# Patient Record
Sex: Female | Born: 1970 | Race: White | Hispanic: No | Marital: Married | Smoking: Never smoker
Health system: Southern US, Community
[De-identification: ages and names within clinical notes are randomized; demographics above are authoritative.]

## PROBLEM LIST (undated history)

## (undated) DIAGNOSIS — I10 Essential (primary) hypertension: Secondary | ICD-10-CM

## (undated) DIAGNOSIS — K219 Gastro-esophageal reflux disease without esophagitis: Secondary | ICD-10-CM

## (undated) HISTORY — DX: Gastro-esophageal reflux disease without esophagitis: K21.9

## (undated) HISTORY — DX: Essential (primary) hypertension: I10

---

## 2015-07-07 ENCOUNTER — Other Ambulatory Visit: Payer: Self-pay | Admitting: Bariatrics

## 2015-07-14 ENCOUNTER — Other Ambulatory Visit: Payer: Self-pay

## 2015-07-14 ENCOUNTER — Ambulatory Visit: Payer: BLUE CROSS/BLUE SHIELD

## 2016-06-20 DIAGNOSIS — M9903 Segmental and somatic dysfunction of lumbar region: Secondary | ICD-10-CM | POA: Diagnosis not present

## 2016-06-20 DIAGNOSIS — M9901 Segmental and somatic dysfunction of cervical region: Secondary | ICD-10-CM | POA: Diagnosis not present

## 2016-06-20 DIAGNOSIS — M9902 Segmental and somatic dysfunction of thoracic region: Secondary | ICD-10-CM | POA: Diagnosis not present

## 2016-06-20 DIAGNOSIS — M9905 Segmental and somatic dysfunction of pelvic region: Secondary | ICD-10-CM | POA: Diagnosis not present

## 2016-09-08 ENCOUNTER — Other Ambulatory Visit: Payer: Self-pay | Admitting: Family Medicine

## 2016-09-08 DIAGNOSIS — Z1231 Encounter for screening mammogram for malignant neoplasm of breast: Secondary | ICD-10-CM

## 2016-09-21 ENCOUNTER — Ambulatory Visit
Admission: RE | Admit: 2016-09-21 | Discharge: 2016-09-21 | Disposition: A | Discharge status: Home / Self Care | Payer: BLUE CROSS/BLUE SHIELD | Source: Ambulatory Visit | Attending: Family Medicine | Admitting: Family Medicine

## 2016-09-21 DIAGNOSIS — Z1231 Encounter for screening mammogram for malignant neoplasm of breast: Secondary | ICD-10-CM | POA: Diagnosis not present

## 2016-12-08 DIAGNOSIS — J3089 Other allergic rhinitis: Secondary | ICD-10-CM | POA: Diagnosis not present

## 2016-12-08 DIAGNOSIS — R05 Cough: Secondary | ICD-10-CM | POA: Diagnosis not present

## 2016-12-08 DIAGNOSIS — J301 Allergic rhinitis due to pollen: Secondary | ICD-10-CM | POA: Diagnosis not present

## 2016-12-08 DIAGNOSIS — T781XXA Other adverse food reactions, not elsewhere classified, initial encounter: Secondary | ICD-10-CM | POA: Diagnosis not present

## 2016-12-08 DIAGNOSIS — J3081 Allergic rhinitis due to animal (cat) (dog) hair and dander: Secondary | ICD-10-CM | POA: Diagnosis not present

## 2016-12-20 ENCOUNTER — Other Ambulatory Visit: Payer: Self-pay | Admitting: Family Medicine

## 2016-12-20 ENCOUNTER — Ambulatory Visit
Admission: RE | Admit: 2016-12-20 | Discharge: 2016-12-20 | Disposition: A | Discharge status: Home / Self Care | Payer: BLUE CROSS/BLUE SHIELD | Source: Ambulatory Visit | Attending: Family Medicine | Admitting: Family Medicine

## 2016-12-20 DIAGNOSIS — M545 Low back pain: Secondary | ICD-10-CM

## 2016-12-20 DIAGNOSIS — G8929 Other chronic pain: Secondary | ICD-10-CM | POA: Diagnosis not present

## 2016-12-20 DIAGNOSIS — M47816 Spondylosis without myelopathy or radiculopathy, lumbar region: Secondary | ICD-10-CM | POA: Diagnosis not present

## 2016-12-20 DIAGNOSIS — R198 Other specified symptoms and signs involving the digestive system and abdomen: Secondary | ICD-10-CM | POA: Diagnosis not present

## 2016-12-20 DIAGNOSIS — R5382 Chronic fatigue, unspecified: Secondary | ICD-10-CM | POA: Diagnosis not present

## 2017-01-03 DIAGNOSIS — M545 Low back pain: Secondary | ICD-10-CM | POA: Diagnosis not present

## 2017-01-03 DIAGNOSIS — M25511 Pain in right shoulder: Secondary | ICD-10-CM | POA: Diagnosis not present

## 2017-01-03 DIAGNOSIS — M25512 Pain in left shoulder: Secondary | ICD-10-CM | POA: Diagnosis not present

## 2017-01-04 DIAGNOSIS — M545 Low back pain: Secondary | ICD-10-CM | POA: Diagnosis not present

## 2017-01-04 DIAGNOSIS — M25511 Pain in right shoulder: Secondary | ICD-10-CM | POA: Diagnosis not present

## 2017-01-04 DIAGNOSIS — M25512 Pain in left shoulder: Secondary | ICD-10-CM | POA: Diagnosis not present

## 2017-01-08 DIAGNOSIS — M25511 Pain in right shoulder: Secondary | ICD-10-CM | POA: Diagnosis not present

## 2017-01-08 DIAGNOSIS — M25512 Pain in left shoulder: Secondary | ICD-10-CM | POA: Diagnosis not present

## 2017-01-08 DIAGNOSIS — M545 Low back pain: Secondary | ICD-10-CM | POA: Diagnosis not present

## 2017-01-23 DIAGNOSIS — M25511 Pain in right shoulder: Secondary | ICD-10-CM | POA: Diagnosis not present

## 2017-01-23 DIAGNOSIS — M545 Low back pain: Secondary | ICD-10-CM | POA: Diagnosis not present

## 2017-01-23 DIAGNOSIS — M25512 Pain in left shoulder: Secondary | ICD-10-CM | POA: Diagnosis not present

## 2017-01-31 DIAGNOSIS — M545 Low back pain: Secondary | ICD-10-CM | POA: Diagnosis not present

## 2017-01-31 DIAGNOSIS — M25511 Pain in right shoulder: Secondary | ICD-10-CM | POA: Diagnosis not present

## 2017-01-31 DIAGNOSIS — M25512 Pain in left shoulder: Secondary | ICD-10-CM | POA: Diagnosis not present

## 2017-02-08 DIAGNOSIS — M25511 Pain in right shoulder: Secondary | ICD-10-CM | POA: Diagnosis not present

## 2017-02-08 DIAGNOSIS — M25512 Pain in left shoulder: Secondary | ICD-10-CM | POA: Diagnosis not present

## 2017-02-08 DIAGNOSIS — M545 Low back pain: Secondary | ICD-10-CM | POA: Diagnosis not present

## 2017-02-23 DIAGNOSIS — M25511 Pain in right shoulder: Secondary | ICD-10-CM | POA: Diagnosis not present

## 2017-02-23 DIAGNOSIS — M545 Low back pain: Secondary | ICD-10-CM | POA: Diagnosis not present

## 2017-02-23 DIAGNOSIS — M25512 Pain in left shoulder: Secondary | ICD-10-CM | POA: Diagnosis not present

## 2017-03-01 DIAGNOSIS — M25511 Pain in right shoulder: Secondary | ICD-10-CM | POA: Diagnosis not present

## 2017-03-01 DIAGNOSIS — M25512 Pain in left shoulder: Secondary | ICD-10-CM | POA: Diagnosis not present

## 2017-03-01 DIAGNOSIS — M545 Low back pain: Secondary | ICD-10-CM | POA: Diagnosis not present

## 2017-03-06 DIAGNOSIS — I10 Essential (primary) hypertension: Secondary | ICD-10-CM | POA: Diagnosis not present

## 2017-03-06 DIAGNOSIS — Z86711 Personal history of pulmonary embolism: Secondary | ICD-10-CM | POA: Diagnosis not present

## 2017-03-06 DIAGNOSIS — J309 Allergic rhinitis, unspecified: Secondary | ICD-10-CM | POA: Diagnosis not present

## 2017-03-06 DIAGNOSIS — K219 Gastro-esophageal reflux disease without esophagitis: Secondary | ICD-10-CM | POA: Diagnosis not present

## 2017-03-08 DIAGNOSIS — M25511 Pain in right shoulder: Secondary | ICD-10-CM | POA: Diagnosis not present

## 2017-03-08 DIAGNOSIS — M25512 Pain in left shoulder: Secondary | ICD-10-CM | POA: Diagnosis not present

## 2017-03-08 DIAGNOSIS — M545 Low back pain: Secondary | ICD-10-CM | POA: Diagnosis not present

## 2017-03-12 DIAGNOSIS — M25511 Pain in right shoulder: Secondary | ICD-10-CM | POA: Diagnosis not present

## 2017-03-12 DIAGNOSIS — M545 Low back pain: Secondary | ICD-10-CM | POA: Diagnosis not present

## 2017-03-12 DIAGNOSIS — M25512 Pain in left shoulder: Secondary | ICD-10-CM | POA: Diagnosis not present

## 2017-03-23 DIAGNOSIS — M25512 Pain in left shoulder: Secondary | ICD-10-CM | POA: Diagnosis not present

## 2017-03-23 DIAGNOSIS — M545 Low back pain: Secondary | ICD-10-CM | POA: Diagnosis not present

## 2017-03-23 DIAGNOSIS — M25511 Pain in right shoulder: Secondary | ICD-10-CM | POA: Diagnosis not present

## 2017-03-30 DIAGNOSIS — M25512 Pain in left shoulder: Secondary | ICD-10-CM | POA: Diagnosis not present

## 2017-03-30 DIAGNOSIS — M545 Low back pain: Secondary | ICD-10-CM | POA: Diagnosis not present

## 2017-03-30 DIAGNOSIS — M25511 Pain in right shoulder: Secondary | ICD-10-CM | POA: Diagnosis not present

## 2017-04-06 DIAGNOSIS — K219 Gastro-esophageal reflux disease without esophagitis: Secondary | ICD-10-CM | POA: Diagnosis not present

## 2017-04-06 DIAGNOSIS — M25512 Pain in left shoulder: Secondary | ICD-10-CM | POA: Diagnosis not present

## 2017-04-06 DIAGNOSIS — M545 Low back pain: Secondary | ICD-10-CM | POA: Diagnosis not present

## 2017-04-06 DIAGNOSIS — Z6841 Body Mass Index (BMI) 40.0 and over, adult: Secondary | ICD-10-CM | POA: Diagnosis not present

## 2017-04-06 DIAGNOSIS — M25511 Pain in right shoulder: Secondary | ICD-10-CM | POA: Diagnosis not present

## 2017-04-11 ENCOUNTER — Other Ambulatory Visit (HOSPITAL_COMMUNITY): Payer: Self-pay | Admitting: Surgery

## 2017-04-13 DIAGNOSIS — M25512 Pain in left shoulder: Secondary | ICD-10-CM | POA: Diagnosis not present

## 2017-04-13 DIAGNOSIS — M545 Low back pain: Secondary | ICD-10-CM | POA: Diagnosis not present

## 2017-04-13 DIAGNOSIS — M25511 Pain in right shoulder: Secondary | ICD-10-CM | POA: Diagnosis not present

## 2017-04-24 DIAGNOSIS — M25511 Pain in right shoulder: Secondary | ICD-10-CM | POA: Diagnosis not present

## 2017-04-24 DIAGNOSIS — M545 Low back pain: Secondary | ICD-10-CM | POA: Diagnosis not present

## 2017-04-24 DIAGNOSIS — M25512 Pain in left shoulder: Secondary | ICD-10-CM | POA: Diagnosis not present

## 2017-04-26 ENCOUNTER — Encounter: Payer: Self-pay | Admitting: Registered"

## 2017-04-26 ENCOUNTER — Encounter: Payer: BLUE CROSS/BLUE SHIELD | Attending: General Surgery | Admitting: Registered"

## 2017-04-26 ENCOUNTER — Ambulatory Visit (HOSPITAL_COMMUNITY): Payer: BLUE CROSS/BLUE SHIELD

## 2017-04-26 ENCOUNTER — Other Ambulatory Visit (HOSPITAL_COMMUNITY): Payer: BLUE CROSS/BLUE SHIELD

## 2017-04-26 DIAGNOSIS — Z6841 Body Mass Index (BMI) 40.0 and over, adult: Secondary | ICD-10-CM | POA: Insufficient documentation

## 2017-04-26 DIAGNOSIS — Z713 Dietary counseling and surveillance: Secondary | ICD-10-CM | POA: Insufficient documentation

## 2017-04-26 DIAGNOSIS — E669 Obesity, unspecified: Secondary | ICD-10-CM

## 2017-04-26 NOTE — Progress Notes (Signed)
Pre-Op Assessment Visit:  Pre-Operative LAGB Surgery  Medical Nutrition Therapy:  Appt start time: 3:15  End time:  4:15  Patient was seen on 04/26/2017 for Pre-Operative Nutrition Assessment. Assessment and [facility] of approval [facility] coordinator on 04/26/2017.   Pt expectation of surgery: remove health risk factors, live healthier longer, be able to move more, not be able to use seat belt extender when traveling, be able to choose stores to shop in and not be restricted to plus size stores  Pt expectation of Dietitian: continue dialogue on how to stay motivated and   Start weight at [facility]: 313.8 BMI: 42.86  Pt reports does not eat red meat. Pt is motivated and eager. Pt reports she travels a lot and eats out 3 meals a day, 5x/ week during that time away from home. Pt reports she craves proteins at times. Pt reports she may try and lose weight on her own without needing surgery. Pt states portion control is worse than her food choices. Pt states she has an all or nothing mindset and struggles at times with being really successful, accomplished but unable to master weight.  Pt will find out specifics with insurance. Pt states she may need an assessment + 3 SWL visits   24 hr Dietary Recall: First Meal: banana, breakfast sandwich Snack: none Second Meal: salads (in office), wrap, bag of chips/pretzels, apple, cookie Snack: none Third Meal: 3 chicken wings, falafel, 2 pcs of fried onion, corn Snack: popcorn, ice cream Beverages: Starbucks coffee (once a day), margarita, diet soda (once a day), water  Encouraged to engage in 150 minutes of moderate physical activity including cardiovascular and weight baring weekly  Handouts given during visit include:  . Pre-Op Goals . Bariatric Surgery Protein Shakes . Vitamin and Mineral Supplements  During the appointment today the following Pre-Op Goals were reviewed with the patient: . Maintain  or lose weight as instructed by your surgeon . Make healthy food choices . Begin to limit portion sizes . Limited concentrated sugars and fried foods . Keep fat/sugar in the single digits per serving on          food labels . Practice CHEWING your food  (aim for 30 chews per bite or until applesauce consistency) . Practice not drinking 15 minutes before, during, and 30 minutes after each meal/snack . Avoid all carbonated beverages  . Avoid/limit caffeinated beverages  . Avoid all sugar-sweetened beverages . Consume 3 meals per day; eat every 3-5 hours . Make a list of non-food related activities . Aim for 64-100 ounces of FLUID daily  . Aim for at least 60-80 grams of PROTEIN daily . Look for a liquid protein source that contain ?15 g protein and ?5 g carbohydrate  (ex: shakes, drinks, shots)  Follow diet recommendations listed below  Energy and Macronutrient Recomendations: Calories: 1800 Carbohydrate: 200 Protein: 135 Fat: 50  Demonstrated degree of understanding via:  Teach Back   Teaching Method Utilized:  Visual Auditory Hands on  Barriers to learning/adherence to lifestyle change: none  Patient to call the [facility] to enroll in Pre-Op and Post-Op Nutrition Education when surgery date is scheduled.

## 2017-05-01 DIAGNOSIS — M25511 Pain in right shoulder: Secondary | ICD-10-CM | POA: Diagnosis not present

## 2017-05-01 DIAGNOSIS — M25512 Pain in left shoulder: Secondary | ICD-10-CM | POA: Diagnosis not present

## 2017-05-01 DIAGNOSIS — M545 Low back pain: Secondary | ICD-10-CM | POA: Diagnosis not present

## 2017-05-17 ENCOUNTER — Ambulatory Visit (HOSPITAL_COMMUNITY)
Admission: RE | Admit: 2017-05-17 | Discharge: 2017-05-17 | Disposition: A | Discharge status: Home / Self Care | Payer: BLUE CROSS/BLUE SHIELD | Source: Ambulatory Visit | Attending: Surgery | Admitting: Surgery

## 2017-05-17 ENCOUNTER — Encounter (HOSPITAL_COMMUNITY): Payer: Self-pay | Admitting: Radiology

## 2017-05-17 ENCOUNTER — Other Ambulatory Visit: Payer: Self-pay

## 2017-05-17 DIAGNOSIS — M25512 Pain in left shoulder: Secondary | ICD-10-CM | POA: Diagnosis not present

## 2017-05-17 DIAGNOSIS — M545 Low back pain: Secondary | ICD-10-CM | POA: Diagnosis not present

## 2017-05-17 DIAGNOSIS — R9431 Abnormal electrocardiogram [ECG] [EKG]: Secondary | ICD-10-CM | POA: Diagnosis not present

## 2017-05-17 DIAGNOSIS — K219 Gastro-esophageal reflux disease without esophagitis: Secondary | ICD-10-CM | POA: Diagnosis not present

## 2017-05-17 DIAGNOSIS — R918 Other nonspecific abnormal finding of lung field: Secondary | ICD-10-CM | POA: Insufficient documentation

## 2017-05-17 DIAGNOSIS — K449 Diaphragmatic hernia without obstruction or gangrene: Secondary | ICD-10-CM | POA: Insufficient documentation

## 2017-05-17 DIAGNOSIS — Z01818 Encounter for other preprocedural examination: Secondary | ICD-10-CM | POA: Diagnosis not present

## 2017-05-17 DIAGNOSIS — M25511 Pain in right shoulder: Secondary | ICD-10-CM | POA: Diagnosis not present

## 2017-05-25 ENCOUNTER — Ambulatory Visit: Payer: BLUE CROSS/BLUE SHIELD | Admitting: Registered"

## 2017-05-29 DIAGNOSIS — M545 Low back pain: Secondary | ICD-10-CM | POA: Diagnosis not present

## 2017-05-29 DIAGNOSIS — M25511 Pain in right shoulder: Secondary | ICD-10-CM | POA: Diagnosis not present

## 2017-05-29 DIAGNOSIS — M25512 Pain in left shoulder: Secondary | ICD-10-CM | POA: Diagnosis not present

## 2017-06-26 DIAGNOSIS — M25511 Pain in right shoulder: Secondary | ICD-10-CM | POA: Diagnosis not present

## 2017-06-26 DIAGNOSIS — M25512 Pain in left shoulder: Secondary | ICD-10-CM | POA: Diagnosis not present

## 2017-06-26 DIAGNOSIS — M545 Low back pain: Secondary | ICD-10-CM | POA: Diagnosis not present

## 2017-06-29 DIAGNOSIS — T781XXD Other adverse food reactions, not elsewhere classified, subsequent encounter: Secondary | ICD-10-CM | POA: Diagnosis not present

## 2017-06-29 DIAGNOSIS — J301 Allergic rhinitis due to pollen: Secondary | ICD-10-CM | POA: Diagnosis not present

## 2017-06-29 DIAGNOSIS — J3081 Allergic rhinitis due to animal (cat) (dog) hair and dander: Secondary | ICD-10-CM | POA: Diagnosis not present

## 2017-06-29 DIAGNOSIS — J3089 Other allergic rhinitis: Secondary | ICD-10-CM | POA: Diagnosis not present

## 2017-07-20 DIAGNOSIS — M25512 Pain in left shoulder: Secondary | ICD-10-CM | POA: Diagnosis not present

## 2017-07-20 DIAGNOSIS — M545 Low back pain: Secondary | ICD-10-CM | POA: Diagnosis not present

## 2017-07-20 DIAGNOSIS — M25511 Pain in right shoulder: Secondary | ICD-10-CM | POA: Diagnosis not present

## 2017-10-29 DIAGNOSIS — Z6841 Body Mass Index (BMI) 40.0 and over, adult: Secondary | ICD-10-CM | POA: Diagnosis not present

## 2017-10-29 DIAGNOSIS — Z131 Encounter for screening for diabetes mellitus: Secondary | ICD-10-CM | POA: Diagnosis not present

## 2017-10-29 DIAGNOSIS — E782 Mixed hyperlipidemia: Secondary | ICD-10-CM | POA: Diagnosis not present

## 2017-10-29 DIAGNOSIS — Z0000 Encounter for general adult medical examination without abnormal findings: Secondary | ICD-10-CM | POA: Diagnosis not present

## 2017-10-29 DIAGNOSIS — I10 Essential (primary) hypertension: Secondary | ICD-10-CM | POA: Diagnosis not present

## 2018-02-20 ENCOUNTER — Other Ambulatory Visit: Payer: Self-pay | Admitting: Family Medicine

## 2018-02-20 DIAGNOSIS — Z1231 Encounter for screening mammogram for malignant neoplasm of breast: Secondary | ICD-10-CM

## 2018-02-28 DIAGNOSIS — R635 Abnormal weight gain: Secondary | ICD-10-CM | POA: Diagnosis not present

## 2018-02-28 DIAGNOSIS — Z6841 Body Mass Index (BMI) 40.0 and over, adult: Secondary | ICD-10-CM | POA: Diagnosis not present

## 2018-03-05 DIAGNOSIS — I10 Essential (primary) hypertension: Secondary | ICD-10-CM | POA: Diagnosis not present

## 2018-03-05 DIAGNOSIS — Z6841 Body Mass Index (BMI) 40.0 and over, adult: Secondary | ICD-10-CM | POA: Diagnosis not present

## 2018-03-05 DIAGNOSIS — R7303 Prediabetes: Secondary | ICD-10-CM | POA: Diagnosis not present

## 2018-03-06 DIAGNOSIS — R9431 Abnormal electrocardiogram [ECG] [EKG]: Secondary | ICD-10-CM | POA: Diagnosis not present

## 2018-03-07 ENCOUNTER — Encounter: Payer: Self-pay | Admitting: Radiology

## 2018-03-07 ENCOUNTER — Ambulatory Visit
Admission: RE | Admit: 2018-03-07 | Discharge: 2018-03-07 | Disposition: A | Discharge status: Home / Self Care | Payer: BLUE CROSS/BLUE SHIELD | Source: Ambulatory Visit | Attending: Family Medicine | Admitting: Family Medicine

## 2018-03-07 DIAGNOSIS — Z1231 Encounter for screening mammogram for malignant neoplasm of breast: Secondary | ICD-10-CM

## 2018-06-07 DIAGNOSIS — T781XXD Other adverse food reactions, not elsewhere classified, subsequent encounter: Secondary | ICD-10-CM | POA: Diagnosis not present

## 2018-06-07 DIAGNOSIS — J3081 Allergic rhinitis due to animal (cat) (dog) hair and dander: Secondary | ICD-10-CM | POA: Diagnosis not present

## 2018-06-07 DIAGNOSIS — J3089 Other allergic rhinitis: Secondary | ICD-10-CM | POA: Diagnosis not present

## 2018-06-07 DIAGNOSIS — J301 Allergic rhinitis due to pollen: Secondary | ICD-10-CM | POA: Diagnosis not present

## 2018-11-15 DIAGNOSIS — Z23 Encounter for immunization: Secondary | ICD-10-CM | POA: Diagnosis not present

## 2018-11-15 DIAGNOSIS — I10 Essential (primary) hypertension: Secondary | ICD-10-CM | POA: Diagnosis not present

## 2018-11-15 DIAGNOSIS — E782 Mixed hyperlipidemia: Secondary | ICD-10-CM | POA: Diagnosis not present

## 2018-11-15 DIAGNOSIS — R7303 Prediabetes: Secondary | ICD-10-CM | POA: Diagnosis not present

## 2019-05-01 DIAGNOSIS — I10 Essential (primary) hypertension: Secondary | ICD-10-CM | POA: Diagnosis not present

## 2019-05-01 DIAGNOSIS — R7303 Prediabetes: Secondary | ICD-10-CM | POA: Diagnosis not present

## 2019-05-01 DIAGNOSIS — Z6841 Body Mass Index (BMI) 40.0 and over, adult: Secondary | ICD-10-CM | POA: Diagnosis not present

## 2019-05-23 DIAGNOSIS — I10 Essential (primary) hypertension: Secondary | ICD-10-CM | POA: Diagnosis not present

## 2019-05-23 DIAGNOSIS — E782 Mixed hyperlipidemia: Secondary | ICD-10-CM | POA: Diagnosis not present

## 2019-05-23 DIAGNOSIS — R7303 Prediabetes: Secondary | ICD-10-CM | POA: Diagnosis not present

## 2019-05-26 DIAGNOSIS — Z0000 Encounter for general adult medical examination without abnormal findings: Secondary | ICD-10-CM | POA: Diagnosis not present

## 2019-05-29 DIAGNOSIS — H903 Sensorineural hearing loss, bilateral: Secondary | ICD-10-CM | POA: Diagnosis not present

## 2019-05-29 DIAGNOSIS — H9313 Tinnitus, bilateral: Secondary | ICD-10-CM | POA: Diagnosis not present

## 2019-06-06 DIAGNOSIS — J3089 Other allergic rhinitis: Secondary | ICD-10-CM | POA: Diagnosis not present

## 2019-06-06 DIAGNOSIS — T781XXD Other adverse food reactions, not elsewhere classified, subsequent encounter: Secondary | ICD-10-CM | POA: Diagnosis not present

## 2019-06-06 DIAGNOSIS — J3081 Allergic rhinitis due to animal (cat) (dog) hair and dander: Secondary | ICD-10-CM | POA: Diagnosis not present

## 2019-06-06 DIAGNOSIS — J301 Allergic rhinitis due to pollen: Secondary | ICD-10-CM | POA: Diagnosis not present

## 2019-06-24 ENCOUNTER — Other Ambulatory Visit: Payer: Self-pay | Admitting: Family Medicine

## 2019-06-24 DIAGNOSIS — Z1231 Encounter for screening mammogram for malignant neoplasm of breast: Secondary | ICD-10-CM

## 2019-08-01 ENCOUNTER — Ambulatory Visit
Admission: RE | Admit: 2019-08-01 | Discharge: 2019-08-01 | Disposition: A | Discharge status: Home / Self Care | Payer: BLUE CROSS/BLUE SHIELD | Source: Ambulatory Visit | Attending: Family Medicine | Admitting: Family Medicine

## 2019-08-01 ENCOUNTER — Other Ambulatory Visit: Payer: Self-pay

## 2019-08-01 DIAGNOSIS — Z1231 Encounter for screening mammogram for malignant neoplasm of breast: Secondary | ICD-10-CM

## 2019-08-07 ENCOUNTER — Ambulatory Visit: Payer: BLUE CROSS/BLUE SHIELD

## 2019-08-08 DIAGNOSIS — E781 Pure hyperglyceridemia: Secondary | ICD-10-CM | POA: Diagnosis not present

## 2019-08-08 DIAGNOSIS — I10 Essential (primary) hypertension: Secondary | ICD-10-CM | POA: Diagnosis not present

## 2019-08-08 DIAGNOSIS — R7303 Prediabetes: Secondary | ICD-10-CM | POA: Diagnosis not present

## 2019-08-08 DIAGNOSIS — E8881 Metabolic syndrome: Secondary | ICD-10-CM | POA: Diagnosis not present

## 2019-08-27 DIAGNOSIS — I10 Essential (primary) hypertension: Secondary | ICD-10-CM | POA: Diagnosis not present

## 2019-08-27 DIAGNOSIS — R7303 Prediabetes: Secondary | ICD-10-CM | POA: Diagnosis not present

## 2019-08-27 DIAGNOSIS — E782 Mixed hyperlipidemia: Secondary | ICD-10-CM | POA: Diagnosis not present

## 2019-11-19 DIAGNOSIS — E781 Pure hyperglyceridemia: Secondary | ICD-10-CM | POA: Diagnosis not present

## 2019-11-19 DIAGNOSIS — R7303 Prediabetes: Secondary | ICD-10-CM | POA: Diagnosis not present

## 2019-11-19 DIAGNOSIS — I10 Essential (primary) hypertension: Secondary | ICD-10-CM | POA: Diagnosis not present

## 2019-11-19 DIAGNOSIS — E8881 Metabolic syndrome: Secondary | ICD-10-CM | POA: Diagnosis not present

## 2020-02-10 DIAGNOSIS — E669 Obesity, unspecified: Secondary | ICD-10-CM | POA: Diagnosis not present

## 2020-02-10 DIAGNOSIS — I10 Essential (primary) hypertension: Secondary | ICD-10-CM | POA: Diagnosis not present

## 2020-02-10 DIAGNOSIS — E8881 Metabolic syndrome: Secondary | ICD-10-CM | POA: Diagnosis not present

## 2020-02-10 DIAGNOSIS — R7303 Prediabetes: Secondary | ICD-10-CM | POA: Diagnosis not present

## 2020-03-29 DIAGNOSIS — Z713 Dietary counseling and surveillance: Secondary | ICD-10-CM | POA: Diagnosis not present

## 2020-03-29 DIAGNOSIS — Z6841 Body Mass Index (BMI) 40.0 and over, adult: Secondary | ICD-10-CM | POA: Diagnosis not present

## 2020-04-20 DIAGNOSIS — F4322 Adjustment disorder with anxiety: Secondary | ICD-10-CM | POA: Diagnosis not present

## 2020-04-27 DIAGNOSIS — F4322 Adjustment disorder with anxiety: Secondary | ICD-10-CM | POA: Diagnosis not present

## 2020-05-04 DIAGNOSIS — F4322 Adjustment disorder with anxiety: Secondary | ICD-10-CM | POA: Diagnosis not present

## 2020-05-11 DIAGNOSIS — F4322 Adjustment disorder with anxiety: Secondary | ICD-10-CM | POA: Diagnosis not present

## 2020-05-18 DIAGNOSIS — F4322 Adjustment disorder with anxiety: Secondary | ICD-10-CM | POA: Diagnosis not present

## 2020-05-25 DIAGNOSIS — F4322 Adjustment disorder with anxiety: Secondary | ICD-10-CM | POA: Diagnosis not present

## 2020-06-01 DIAGNOSIS — F4322 Adjustment disorder with anxiety: Secondary | ICD-10-CM | POA: Diagnosis not present

## 2020-06-04 DIAGNOSIS — J3089 Other allergic rhinitis: Secondary | ICD-10-CM | POA: Diagnosis not present

## 2020-06-04 DIAGNOSIS — T781XXD Other adverse food reactions, not elsewhere classified, subsequent encounter: Secondary | ICD-10-CM | POA: Diagnosis not present

## 2020-06-04 DIAGNOSIS — J301 Allergic rhinitis due to pollen: Secondary | ICD-10-CM | POA: Diagnosis not present

## 2020-06-04 DIAGNOSIS — J3081 Allergic rhinitis due to animal (cat) (dog) hair and dander: Secondary | ICD-10-CM | POA: Diagnosis not present

## 2020-06-15 DIAGNOSIS — R7303 Prediabetes: Secondary | ICD-10-CM | POA: Diagnosis not present

## 2020-06-15 DIAGNOSIS — I10 Essential (primary) hypertension: Secondary | ICD-10-CM | POA: Diagnosis not present

## 2020-06-15 DIAGNOSIS — E8881 Metabolic syndrome: Secondary | ICD-10-CM | POA: Diagnosis not present

## 2020-06-15 DIAGNOSIS — R635 Abnormal weight gain: Secondary | ICD-10-CM | POA: Diagnosis not present

## 2020-07-20 ENCOUNTER — Other Ambulatory Visit (HOSPITAL_COMMUNITY)
Admission: RE | Admit: 2020-07-20 | Discharge: 2020-07-20 | Disposition: A | Discharge status: Home / Self Care | Payer: BC Managed Care – PPO | Source: Ambulatory Visit | Attending: Family Medicine | Admitting: Family Medicine

## 2020-07-20 ENCOUNTER — Other Ambulatory Visit: Payer: Self-pay | Admitting: Family Medicine

## 2020-07-20 DIAGNOSIS — E782 Mixed hyperlipidemia: Secondary | ICD-10-CM | POA: Diagnosis not present

## 2020-07-20 DIAGNOSIS — E669 Obesity, unspecified: Secondary | ICD-10-CM | POA: Diagnosis not present

## 2020-07-20 DIAGNOSIS — Z124 Encounter for screening for malignant neoplasm of cervix: Secondary | ICD-10-CM | POA: Diagnosis not present

## 2020-07-20 DIAGNOSIS — I10 Essential (primary) hypertension: Secondary | ICD-10-CM | POA: Diagnosis not present

## 2020-07-20 DIAGNOSIS — Z01411 Encounter for gynecological examination (general) (routine) with abnormal findings: Secondary | ICD-10-CM | POA: Insufficient documentation

## 2020-07-20 DIAGNOSIS — E8881 Metabolic syndrome: Secondary | ICD-10-CM | POA: Diagnosis not present

## 2020-07-20 DIAGNOSIS — Z0000 Encounter for general adult medical examination without abnormal findings: Secondary | ICD-10-CM | POA: Diagnosis not present

## 2020-07-20 DIAGNOSIS — R7303 Prediabetes: Secondary | ICD-10-CM | POA: Diagnosis not present

## 2020-07-22 LAB — CYTOLOGY - PAP
Comment: NEGATIVE
Diagnosis: NEGATIVE
High risk HPV: NEGATIVE

## 2020-07-29 ENCOUNTER — Other Ambulatory Visit: Payer: Self-pay | Admitting: Family Medicine

## 2020-07-29 DIAGNOSIS — Z1231 Encounter for screening mammogram for malignant neoplasm of breast: Secondary | ICD-10-CM

## 2020-08-04 ENCOUNTER — Ambulatory Visit
Admission: RE | Admit: 2020-08-04 | Discharge: 2020-08-04 | Disposition: A | Discharge status: Home / Self Care | Payer: BC Managed Care – PPO | Source: Ambulatory Visit

## 2020-08-04 ENCOUNTER — Other Ambulatory Visit: Payer: Self-pay

## 2020-08-04 DIAGNOSIS — Z1231 Encounter for screening mammogram for malignant neoplasm of breast: Secondary | ICD-10-CM | POA: Diagnosis not present

## 2020-11-05 DIAGNOSIS — Z7984 Long term (current) use of oral hypoglycemic drugs: Secondary | ICD-10-CM | POA: Diagnosis not present

## 2020-11-05 DIAGNOSIS — R7303 Prediabetes: Secondary | ICD-10-CM | POA: Diagnosis not present

## 2020-11-05 DIAGNOSIS — I10 Essential (primary) hypertension: Secondary | ICD-10-CM | POA: Diagnosis not present

## 2020-11-05 DIAGNOSIS — E786 Lipoprotein deficiency: Secondary | ICD-10-CM | POA: Diagnosis not present

## 2021-02-08 DIAGNOSIS — R7303 Prediabetes: Secondary | ICD-10-CM | POA: Diagnosis not present

## 2021-02-08 DIAGNOSIS — I10 Essential (primary) hypertension: Secondary | ICD-10-CM | POA: Diagnosis not present

## 2021-02-18 DIAGNOSIS — Z6839 Body mass index (BMI) 39.0-39.9, adult: Secondary | ICD-10-CM | POA: Diagnosis not present

## 2021-02-18 DIAGNOSIS — R7303 Prediabetes: Secondary | ICD-10-CM | POA: Diagnosis not present

## 2021-02-18 DIAGNOSIS — E569 Vitamin deficiency, unspecified: Secondary | ICD-10-CM | POA: Diagnosis not present

## 2021-02-18 DIAGNOSIS — I10 Essential (primary) hypertension: Secondary | ICD-10-CM | POA: Diagnosis not present

## 2021-02-18 DIAGNOSIS — E781 Pure hyperglyceridemia: Secondary | ICD-10-CM | POA: Diagnosis not present

## 2021-02-18 DIAGNOSIS — E8881 Metabolic syndrome: Secondary | ICD-10-CM | POA: Diagnosis not present

## 2021-03-12 IMAGING — MG DIGITAL SCREENING BILAT W/ TOMO W/ CAD
8 series · 8 of 24 positions shown · non-contrast
Comparison: Previous exam(s).

CLINICAL DATA: Screening.

EXAM:
DIGITAL SCREENING BILATERAL MAMMOGRAM WITH TOMO AND CAD

[R MLO synth-2D]
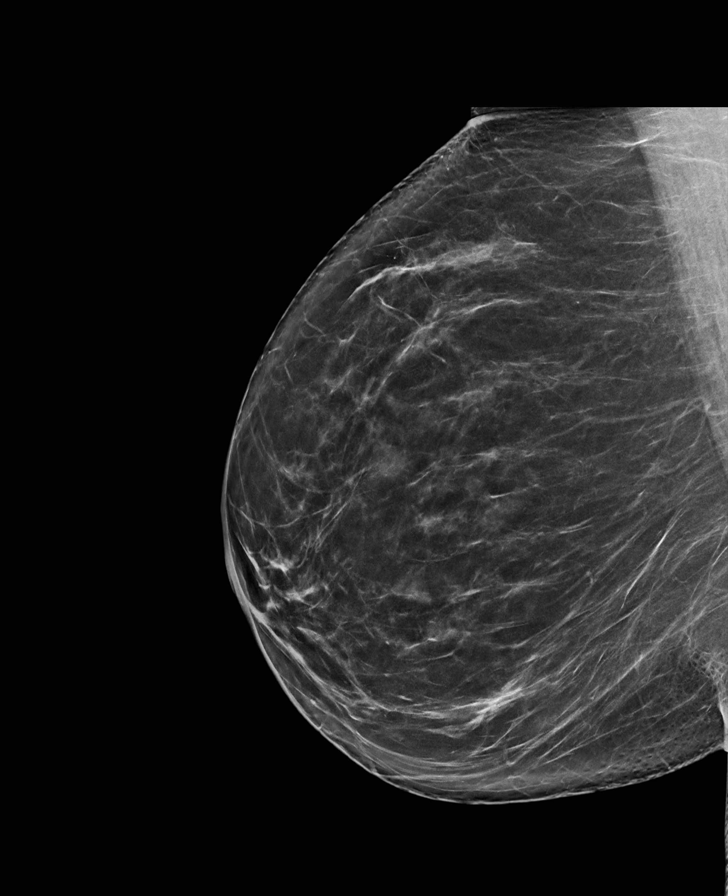

[L CC synth-2D]
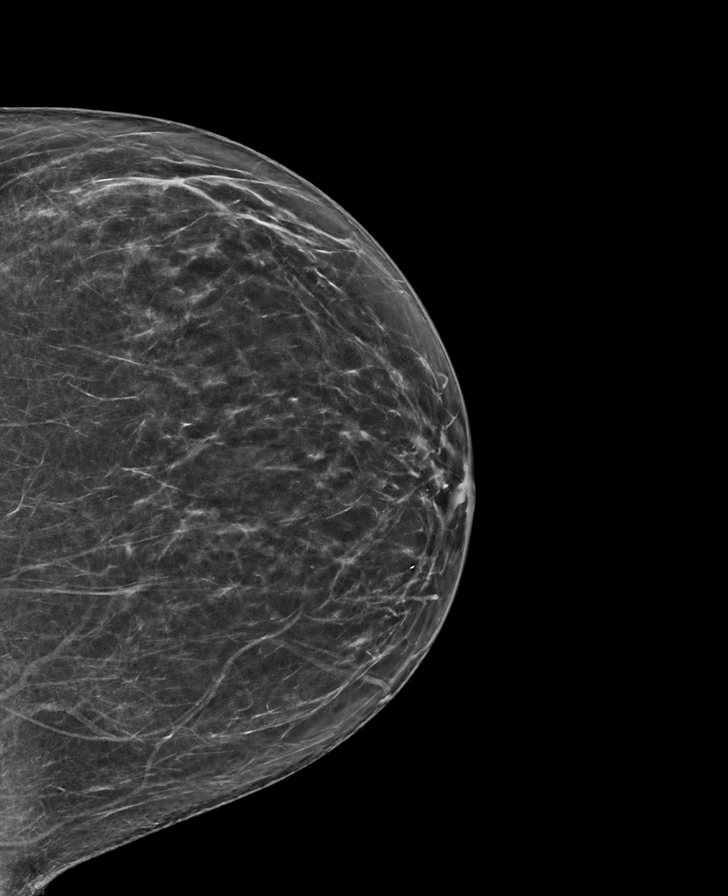

[R CC synth-2D]
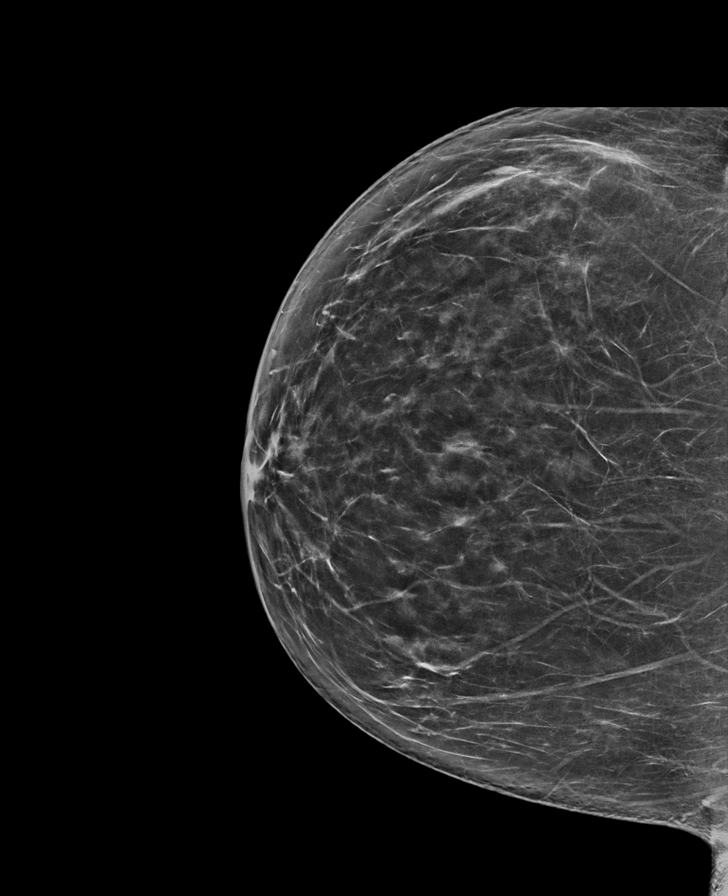

[L MLO synth-2D]
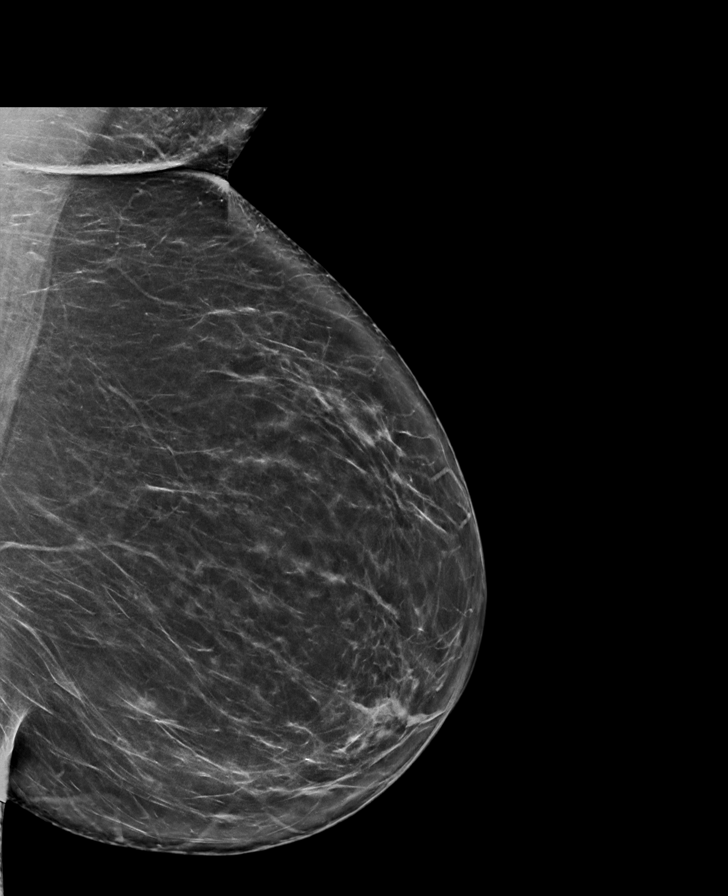

[R CC tomo · tomo slice 38/75.0]
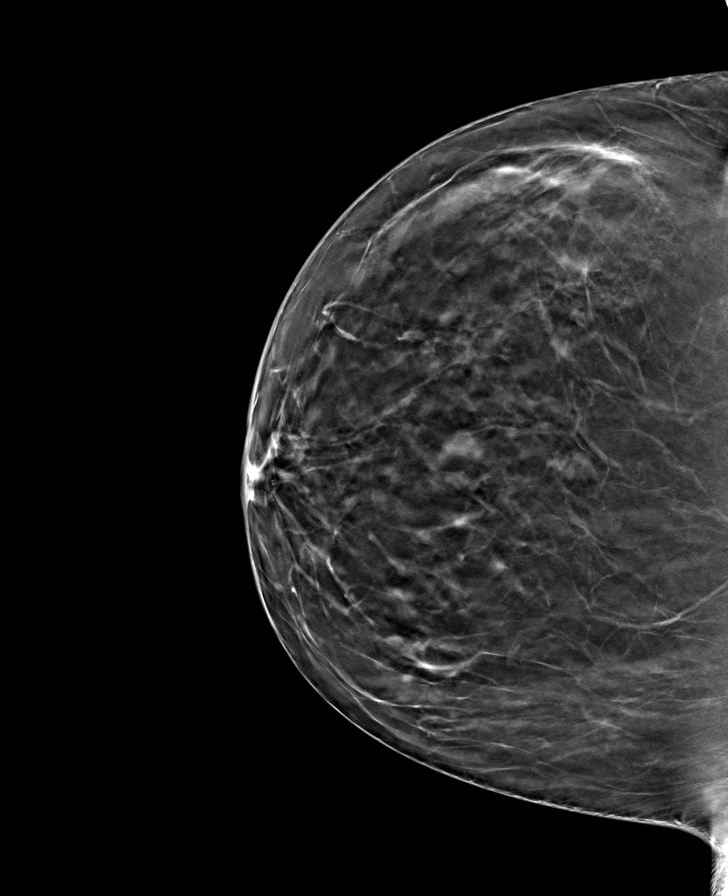

[L CC tomo · tomo slice 35/70.0]
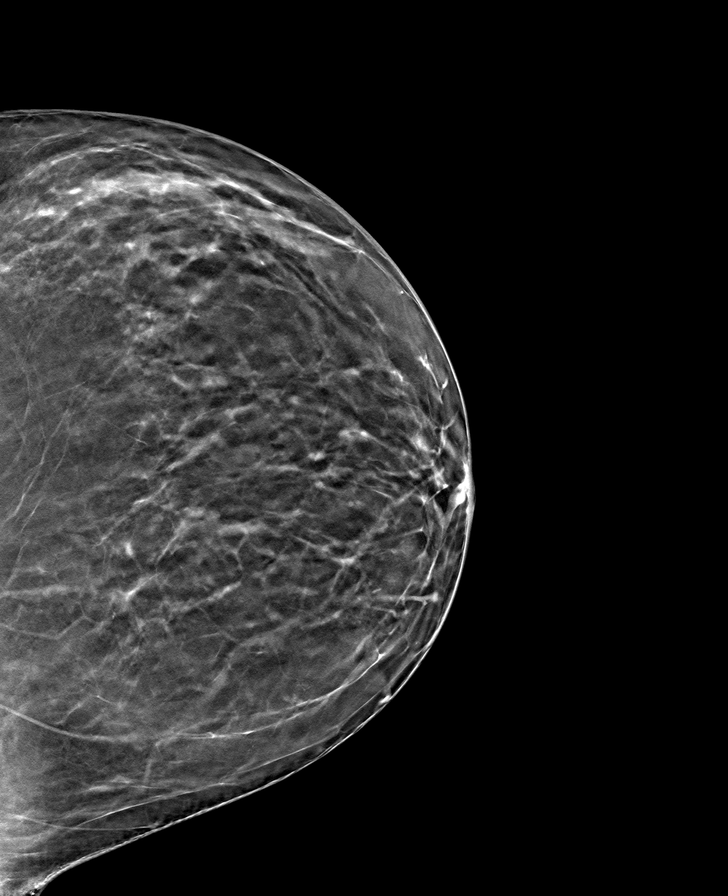

[R MLO tomo · tomo slice 43/84.0]
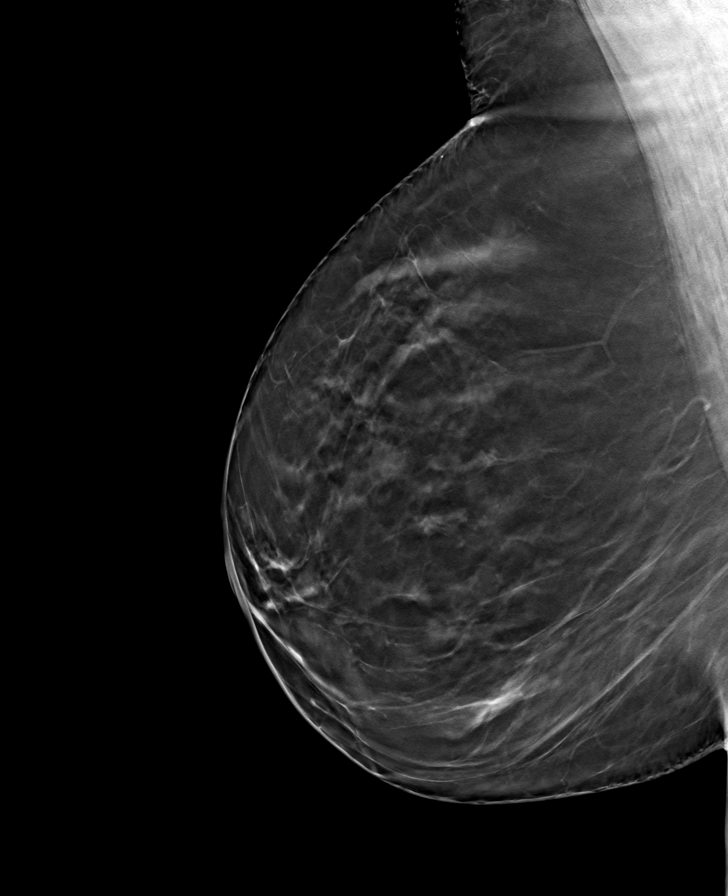

[L MLO tomo · tomo slice 43/85.0]
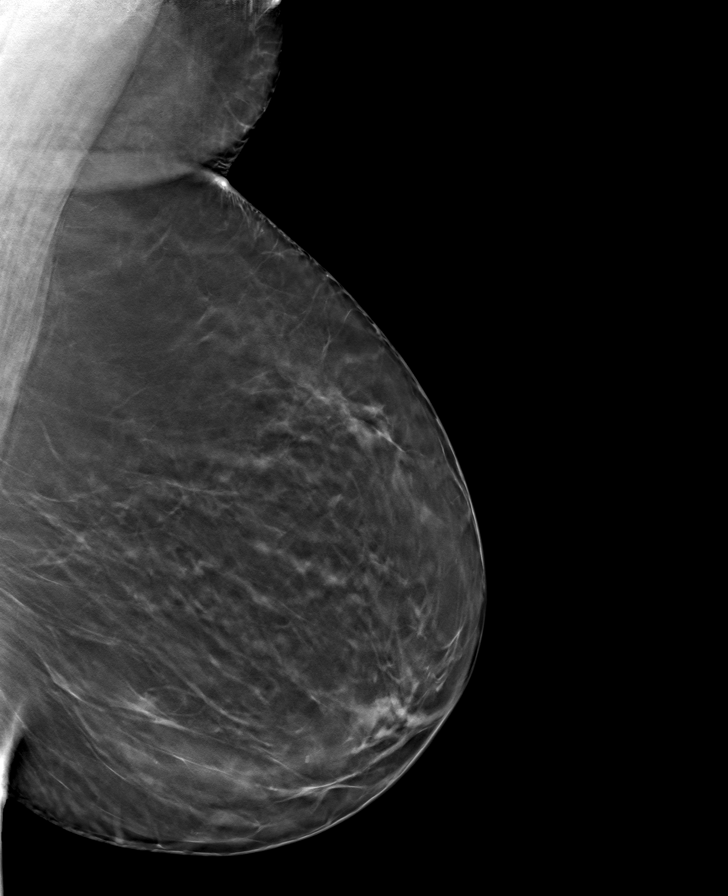

[8 of 24 positions shown; findings below may reference images not displayed]

ACR Breast Density Category b: There are scattered areas of
fibroglandular density.
FINDINGS: There are no findings suspicious for malignancy. Images were
processed with CAD.
IMPRESSION: No mammographic evidence of malignancy. A result letter of this
screening mammogram will be mailed directly to the patient.

RECOMMENDATION:
Screening mammogram in one year. (Code:[id number])

BI-RADS CATEGORY  1: Negative.

## 2021-05-12 DIAGNOSIS — R7303 Prediabetes: Secondary | ICD-10-CM | POA: Diagnosis not present

## 2021-05-12 DIAGNOSIS — E785 Hyperlipidemia, unspecified: Secondary | ICD-10-CM | POA: Diagnosis not present

## 2021-05-12 DIAGNOSIS — E669 Obesity, unspecified: Secondary | ICD-10-CM | POA: Diagnosis not present

## 2021-05-31 DIAGNOSIS — L03313 Cellulitis of chest wall: Secondary | ICD-10-CM | POA: Diagnosis not present

## 2021-06-23 DIAGNOSIS — R7303 Prediabetes: Secondary | ICD-10-CM | POA: Diagnosis not present

## 2021-06-23 DIAGNOSIS — E559 Vitamin D deficiency, unspecified: Secondary | ICD-10-CM | POA: Diagnosis not present

## 2021-06-23 DIAGNOSIS — E785 Hyperlipidemia, unspecified: Secondary | ICD-10-CM | POA: Diagnosis not present

## 2021-07-25 DIAGNOSIS — Z0000 Encounter for general adult medical examination without abnormal findings: Secondary | ICD-10-CM | POA: Diagnosis not present

## 2021-07-25 DIAGNOSIS — I10 Essential (primary) hypertension: Secondary | ICD-10-CM | POA: Diagnosis not present

## 2021-07-25 DIAGNOSIS — E782 Mixed hyperlipidemia: Secondary | ICD-10-CM | POA: Diagnosis not present

## 2021-07-25 DIAGNOSIS — R7303 Prediabetes: Secondary | ICD-10-CM | POA: Diagnosis not present

## 2021-09-09 DIAGNOSIS — E669 Obesity, unspecified: Secondary | ICD-10-CM | POA: Diagnosis not present

## 2021-09-09 DIAGNOSIS — Z6837 Body mass index (BMI) 37.0-37.9, adult: Secondary | ICD-10-CM | POA: Diagnosis not present

## 2021-09-09 DIAGNOSIS — R197 Diarrhea, unspecified: Secondary | ICD-10-CM | POA: Diagnosis not present

## 2021-09-09 DIAGNOSIS — E781 Pure hyperglyceridemia: Secondary | ICD-10-CM | POA: Diagnosis not present

## 2021-10-20 ENCOUNTER — Other Ambulatory Visit: Payer: Self-pay | Admitting: Family Medicine

## 2021-10-20 DIAGNOSIS — Z1231 Encounter for screening mammogram for malignant neoplasm of breast: Secondary | ICD-10-CM

## 2021-10-28 ENCOUNTER — Ambulatory Visit
Admission: RE | Admit: 2021-10-28 | Discharge: 2021-10-28 | Disposition: A | Discharge status: Home / Self Care | Payer: BC Managed Care – PPO | Source: Ambulatory Visit

## 2021-10-28 ENCOUNTER — Other Ambulatory Visit: Payer: Self-pay

## 2021-10-28 DIAGNOSIS — Z1231 Encounter for screening mammogram for malignant neoplasm of breast: Secondary | ICD-10-CM | POA: Diagnosis not present

## 2021-10-28 DIAGNOSIS — F5104 Psychophysiologic insomnia: Secondary | ICD-10-CM | POA: Diagnosis not present

## 2021-10-28 DIAGNOSIS — R7303 Prediabetes: Secondary | ICD-10-CM | POA: Diagnosis not present

## 2021-11-24 DIAGNOSIS — K219 Gastro-esophageal reflux disease without esophagitis: Secondary | ICD-10-CM | POA: Diagnosis not present

## 2021-11-24 DIAGNOSIS — Z1211 Encounter for screening for malignant neoplasm of colon: Secondary | ICD-10-CM | POA: Diagnosis not present

## 2021-11-24 DIAGNOSIS — R194 Change in bowel habit: Secondary | ICD-10-CM | POA: Diagnosis not present

## 2021-12-06 DIAGNOSIS — Z1211 Encounter for screening for malignant neoplasm of colon: Secondary | ICD-10-CM | POA: Diagnosis not present

## 2021-12-13 DIAGNOSIS — I10 Essential (primary) hypertension: Secondary | ICD-10-CM | POA: Diagnosis not present

## 2021-12-13 DIAGNOSIS — E669 Obesity, unspecified: Secondary | ICD-10-CM | POA: Diagnosis not present

## 2021-12-13 DIAGNOSIS — R7303 Prediabetes: Secondary | ICD-10-CM | POA: Diagnosis not present

## 2021-12-13 DIAGNOSIS — E781 Pure hyperglyceridemia: Secondary | ICD-10-CM | POA: Diagnosis not present

## 2022-02-15 DIAGNOSIS — E119 Type 2 diabetes mellitus without complications: Secondary | ICD-10-CM | POA: Diagnosis not present

## 2022-02-20 DIAGNOSIS — Z1211 Encounter for screening for malignant neoplasm of colon: Secondary | ICD-10-CM | POA: Diagnosis not present

## 2022-02-20 DIAGNOSIS — R194 Change in bowel habit: Secondary | ICD-10-CM | POA: Diagnosis not present

## 2022-04-19 DIAGNOSIS — E786 Lipoprotein deficiency: Secondary | ICD-10-CM | POA: Diagnosis not present

## 2022-04-19 DIAGNOSIS — R7303 Prediabetes: Secondary | ICD-10-CM | POA: Diagnosis not present

## 2022-04-19 DIAGNOSIS — I10 Essential (primary) hypertension: Secondary | ICD-10-CM | POA: Diagnosis not present

## 2022-04-19 DIAGNOSIS — E669 Obesity, unspecified: Secondary | ICD-10-CM | POA: Diagnosis not present

## 2022-05-17 DIAGNOSIS — H938X2 Other specified disorders of left ear: Secondary | ICD-10-CM | POA: Diagnosis not present

## 2022-05-17 DIAGNOSIS — J029 Acute pharyngitis, unspecified: Secondary | ICD-10-CM | POA: Diagnosis not present

## 2022-06-01 DIAGNOSIS — H6502 Acute serous otitis media, left ear: Secondary | ICD-10-CM | POA: Diagnosis not present

## 2022-07-03 DIAGNOSIS — F4322 Adjustment disorder with anxiety: Secondary | ICD-10-CM | POA: Diagnosis not present

## 2022-08-04 DIAGNOSIS — I10 Essential (primary) hypertension: Secondary | ICD-10-CM | POA: Diagnosis not present

## 2022-08-04 DIAGNOSIS — F5104 Psychophysiologic insomnia: Secondary | ICD-10-CM | POA: Diagnosis not present

## 2022-08-04 DIAGNOSIS — E782 Mixed hyperlipidemia: Secondary | ICD-10-CM | POA: Diagnosis not present

## 2022-08-04 DIAGNOSIS — Z0000 Encounter for general adult medical examination without abnormal findings: Secondary | ICD-10-CM | POA: Diagnosis not present

## 2022-08-04 DIAGNOSIS — R7303 Prediabetes: Secondary | ICD-10-CM | POA: Diagnosis not present

## 2022-08-07 DIAGNOSIS — F4322 Adjustment disorder with anxiety: Secondary | ICD-10-CM | POA: Diagnosis not present

## 2022-08-29 DIAGNOSIS — E669 Obesity, unspecified: Secondary | ICD-10-CM | POA: Diagnosis not present

## 2022-08-29 DIAGNOSIS — R7303 Prediabetes: Secondary | ICD-10-CM | POA: Diagnosis not present

## 2022-08-29 DIAGNOSIS — E781 Pure hyperglyceridemia: Secondary | ICD-10-CM | POA: Diagnosis not present

## 2022-08-29 DIAGNOSIS — I10 Essential (primary) hypertension: Secondary | ICD-10-CM | POA: Diagnosis not present

## 2022-09-04 DIAGNOSIS — F5081 Binge eating disorder: Secondary | ICD-10-CM | POA: Diagnosis not present

## 2022-10-02 DIAGNOSIS — F4320 Adjustment disorder, unspecified: Secondary | ICD-10-CM | POA: Diagnosis not present

## 2022-11-06 DIAGNOSIS — F4322 Adjustment disorder with anxiety: Secondary | ICD-10-CM | POA: Diagnosis not present

## 2022-11-17 DIAGNOSIS — Z1231 Encounter for screening mammogram for malignant neoplasm of breast: Secondary | ICD-10-CM | POA: Diagnosis not present

## 2023-06-22 ENCOUNTER — Other Ambulatory Visit: Payer: Self-pay

## 2023-06-22 ENCOUNTER — Other Ambulatory Visit (HOSPITAL_COMMUNITY): Payer: Self-pay

## 2023-06-22 MED ORDER — WEGOVY 0.25 MG/0.5ML ~~LOC~~ SOAJ
0.2500 mg | SUBCUTANEOUS | 0 refills | Status: AC
  Filled 2023-06-22: qty 2, 28d supply, fill #0

## 2023-06-25 ENCOUNTER — Other Ambulatory Visit (HOSPITAL_COMMUNITY): Payer: Self-pay
